# Patient Record
Sex: Female | Born: 2005 | Race: White | Hispanic: Yes | Marital: Single | Smoking: Never smoker
Health system: Southern US, Community
[De-identification: ages and names within clinical notes are randomized; demographics above are authoritative.]

## PROBLEM LIST (undated history)

## (undated) DIAGNOSIS — J45909 Unspecified asthma, uncomplicated: Secondary | ICD-10-CM

## (undated) HISTORY — DX: Unspecified asthma, uncomplicated: J45.909

---
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
## 2023-01-12 ENCOUNTER — Ambulatory Visit: Payer: Self-pay | Admitting: Pediatrics

## 2023-02-04 ENCOUNTER — Encounter: Payer: Self-pay | Admitting: Pediatrics

## 2023-02-04 ENCOUNTER — Ambulatory Visit: Payer: Medicaid Other | Admitting: Pediatrics

## 2023-02-04 VITALS — BP 116/70 | HR 80 | Temp 98.4°F | Ht 61.3 in | Wt 111.0 lb

## 2023-02-04 DIAGNOSIS — M41125 Adolescent idiopathic scoliosis, thoracolumbar region: Secondary | ICD-10-CM

## 2023-02-04 DIAGNOSIS — Z113 Encounter for screening for infections with a predominantly sexual mode of transmission: Secondary | ICD-10-CM | POA: Diagnosis not present

## 2023-02-04 DIAGNOSIS — Z0001 Encounter for general adult medical examination with abnormal findings: Secondary | ICD-10-CM | POA: Diagnosis not present

## 2023-02-04 DIAGNOSIS — Z0000 Encounter for general adult medical examination without abnormal findings: Secondary | ICD-10-CM

## 2023-02-04 DIAGNOSIS — Z00121 Encounter for routine child health examination with abnormal findings: Secondary | ICD-10-CM

## 2023-02-05 LAB — C. TRACHOMATIS/N. GONORRHOEAE RNA
C. trachomatis RNA, TMA: NOT DETECTED
N. gonorrhoeae RNA, TMA: NOT DETECTED

## 2023-02-15 NOTE — Progress Notes (Signed)
Adolescent Well Care Visit Nancy Price is a 17 y.o. female who is here for well care.    PCP:  Lucio Edward, MD   History was provided by the patient.  Confidentiality was discussed with the patient and, if applicable, with caregiver as well. Patient's personal or confidential phone number:     Current Issues: Current concerns include none.   Nutrition: Nutrition/Eating Behaviors: Varied diet Adequate calcium in diet?:  Yes Supplements/ Vitamins: No  Exercise/ Media: Play any Sports?/ Exercise: No Screen Time:  > 2 hours-counseling provided Media Rules or Monitoring?: no  Sleep:  Sleep: 7 to 8 hours  Social Screening: Lives with: Mother and siblings Parental relations:  good Activities, Work, and Regulatory affairs officer?:  Yes Concerns regarding behavior with peers?  no Stressors of note: no  Education: School Name: [employer], [employer], would like to either become a Naval architect School Grade: Senior School performance: doing well; no concerns School Behavior: doing well; no concerns  Menstruation:   No LMP recorded. Menstrual History: Regular  Confidential Social History: Tobacco?  no Secondhand smoke exposure?  no Drugs/ETOH?  no  Sexually Active?  yes   Pregnancy Prevention: Condoms, last sexual activity was 1 year ago  Safe at home, in school & in relationships?  Yes Safe to self?  Yes   Screenings: Patient has a dental home: yes   PHQ-9 completed and results indicated no concerns  Physical Exam:  Vitals:   02/04/23 1412  BP: 116/70  Pulse: 80  Temp: 98.4 F (36.9 C)  Weight: 111 lb (50.3 kg)  Height: 5' 1.3" (1.557 m)   BP 116/70   Pulse 80   Temp 98.4 F (36.9 C)   Ht 5' 1.3" (1.557 m)   Wt 111 lb (50.3 kg)   BMI 20.77 kg/m  Body mass index: body mass index is 20.77 kg/m. Blood pressure %iles are not available for patients who are 18 years or older.  Hearing Screening   500Hz  1000Hz  2000Hz  3000Hz  4000Hz    Right ear 20 20 20 20 20   Left ear 20 20 20 20 20    Vision Screening   Right eye Left eye Both eyes  Without correction 20/20 20/20 20/20   With correction       General Appearance:   alert, oriented, no acute distress and well nourished  HENT: Normocephalic, no obvious abnormality, conjunctiva clear  Mouth:   Normal appearing teeth, no obvious discoloration, dental caries, or dental caps  Neck:   Supple; thyroid: no enlargement, symmetric, no tenderness/mass/nodules  Chest Not examined  Lungs:   Clear to auscultation bilaterally, normal work of breathing  Heart:   Regular rate and rhythm, S1 and S2 normal, no murmurs;   Abdomen:   Soft, non-tender, no mass, or organomegaly  GU Not examined  Musculoskeletal:   Tone and strength strong and symmetrical, all extremities, mild cervical scoliosis noted               Lymphatic:   No cervical adenopathy  Skin/Hair/Nails:   Skin warm, dry and intact, no rashes, no bruises or petechiae  Neurologic:   Strength, gait, and coordination normal and age-appropriate     Assessment and Plan:   1.  Well-child check 2.  Scoliosis-mild  BMI is appropriate for age  Hearing screening result:normal Vision screening result: normal  Counseling provided for all of the vaccine components  Orders Placed This Encounter  Procedures   C. trachomatis/N. gonorrhoeae RNA  No follow-ups on file.Lucio Edward, MD

## 2023-10-11 ENCOUNTER — Ambulatory Visit
Admission: EM | Admit: 2023-10-11 | Discharge: 2023-10-11 | Disposition: A | Discharge status: Home / Self Care | Payer: Medicaid Other | Attending: Nurse Practitioner | Admitting: Nurse Practitioner

## 2023-10-11 DIAGNOSIS — U071 COVID-19: Secondary | ICD-10-CM

## 2023-10-11 LAB — POCT RAPID STREP A (OFFICE): Rapid Strep A Screen: NEGATIVE

## 2023-10-11 LAB — POC COVID19/FLU A&B COMBO
Covid Antigen, POC: POSITIVE — AB
Influenza A Antigen, POC: NEGATIVE
Influenza B Antigen, POC: NEGATIVE

## 2023-10-11 MED ORDER — PROMETHAZINE-DM 6.25-15 MG/5ML PO SYRP: 5.0000 mL | ORAL_SOLUTION | Freq: Four times a day (QID) | ORAL | 0 refills | Status: DC | PRN

## 2023-10-11 MED ORDER — FLUTICASONE PROPIONATE 50 MCG/ACT NA SUSP: 2.0000 | Freq: Every day | NASAL | 0 refills | Status: AC

## 2023-10-11 MED ORDER — PAXLOVID (300/100) 20 X 150 MG & 10 X 100MG PO TBPK: 3.0000 | ORAL_TABLET | Freq: Two times a day (BID) | ORAL | 0 refills | Status: AC

## 2023-10-11 MED ORDER — ALBUTEROL SULFATE HFA 108 (90 BASE) MCG/ACT IN AERS: 2.0000 | INHALATION_SPRAY | Freq: Four times a day (QID) | RESPIRATORY_TRACT | 0 refills | Status: AC | PRN

## 2023-10-11 NOTE — ED Provider Notes (Signed)
 [facility]    CSN: [id number] Arrival date & time: 10/11/23  1104      History   Chief Complaint Chief Complaint  Patient presents with   Sore Throat   Nasal Congestion    HPI Nancy Price is a 18 y.o. female.   The history is provided by the patient.   The patient presents with a 3-day history of sore throat, headache, nasal congestion, and cough.  Patient also suspects that she has had fevers.  States that she experienced episodes of feeling hot and then cold.  Patient denies ear pain, ear drainage, wheezing, difficulty breathing, chest pain, abdominal pain, nausea, vomiting, diarrhea, or rash.  Patient reports she has been taking Tylenol  and DayQuil for her symptoms.  Reports that several of her other family members have been sick with the same or similar symptoms.  Patient with underlying history of asthma.  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 No pertinent surgical history.  OB History   No obstetric history on file.      Home Medications    Prior to Admission medications   Medication Sig Start Date End Date Taking? Authorizing Provider  albuterol  (VENTOLIN  HFA) 108 (90 Base) MCG/ACT inhaler Inhale 2 puffs into the lungs every 6 (six) hours as needed. 10/11/23  Yes Leath-Warren, Etta PARAS, NP  fluticasone  (FLONASE ) 50 MCG/ACT nasal spray Place 2 sprays into both nostrils daily. 10/11/23  Yes Leath-Warren, Etta PARAS, NP  nirmatrelvir/ritonavir (PAXLOVID , 300/100,) 20 x 150 MG & 10 x 100MG  TBPK Take 3 tablets by mouth 2 (two) times daily for 5 days. Take nirmatrelvir (150 mg) two tablets twice daily for 5 days and ritonavir (100 mg) one tablet twice daily for 5 days. 10/11/23 10/16/23 Yes  Leath-Warren, Etta PARAS, NP  promethazine -dextromethorphan  (PROMETHAZINE -DM) 6.25-15 MG/5ML syrup Take 5 mLs by mouth 4 (four) times daily as needed. 10/11/23  Yes Leath-Warren, Etta PARAS, NP  aluminum chloride (DRYSOL) 20 % external solution Dispense brand or generic for insurance. Apply to skin at bedtime and rinse off in the mornings Patient not taking: Reported on 02/04/2023 07/30/20   Theotis Allena HERO, MD  cetirizine  (ZYRTEC ) 10 MG tablet Take 1 tablet (10 mg total) by mouth daily. Patient not taking: Reported on 02/04/2023 06/10/18   Theotis Allena HERO, MD  Glycopyrronium Tosylate  (QBREXZA ) 2.4 % PADS Wipe each underarm and hands with one cloth in evening. Let hands dry and then wash hands twice with soap and water after 15 minutes Patient not taking: Reported on 02/04/2023 01/09/21   Hester Alm BROCKS, MD  triamcinolone  (ORALONE ) 0.1 % paste Use as directed 1 application in the mouth or throat 2 (two) times daily. Patient not taking: Reported on 02/04/2023 08/27/20   Avegno, Komlanvi S, FNP    Family History Family History  Problem Relation Age of Onset   Malignant hyperthermia Mother    Diabetes Father    Asthma Sister    Cancer Paternal Grandmother    Liver disease Paternal Grandmother    Diabetes Maternal Uncle    Diabetes Maternal Grandfather    Asthma Brother     Social History Social History   Tobacco Use   Smoking status: Never   Smokeless tobacco:  Never  Substance Use Topics   Alcohol use: No   Drug use: No     Allergies   Patient has no known allergies.   Review of Systems Review of Systems Per HPI  Physical Exam Triage Vital Signs ED Triage Vitals [10/11/23 1136]  Encounter Vitals Group     BP 128/82     Systolic BP Percentile      Diastolic BP Percentile      Pulse Rate 99     Resp 14     Temp 98 F (36.7 C)     Temp Source Oral     SpO2 100 %     Weight      Height      Head Circumference      Peak Flow      Pain Score 4     Pain Loc       Pain Education      Exclude from Growth Chart    No data found.  Updated Vital Signs BP 128/82 (BP Location: Right Arm)   Pulse 99   Temp 98 F (36.7 C) (Oral)   Resp 14   LMP 09/12/2023 (Approximate)   SpO2 100%   Visual Acuity Right Eye Distance:   Left Eye Distance:   Bilateral Distance:    Right Eye Near:   Left Eye Near:    Bilateral Near:     Physical Exam Vitals and nursing note reviewed.  Constitutional:      General: She is not in acute distress.    Appearance: She is well-developed.  HENT:     Head: Normocephalic.     Right Ear: Tympanic membrane, ear canal and external ear normal.     Left Ear: Tympanic membrane, ear canal and external ear normal.     Nose: Congestion present.     Right Turbinates: Enlarged and swollen.     Left Turbinates: Enlarged and swollen.     Right Sinus: No maxillary sinus tenderness or frontal sinus tenderness.     Left Sinus: No maxillary sinus tenderness or frontal sinus tenderness.     Mouth/Throat:     Lips: Pink.     Mouth: Mucous membranes are moist.     Pharynx: Uvula midline. Pharyngeal swelling, posterior oropharyngeal erythema and postnasal drip present. No oropharyngeal exudate or uvula swelling.     Tonsils: No tonsillar exudate. 1+ on the right. 1+ on the left.     Comments: Cobblestoning present to posterior oropharynx  Eyes:     Conjunctiva/sclera: Conjunctivae normal.     Pupils: Pupils are equal, round, and reactive to light.  Cardiovascular:     Rate and Rhythm: Normal rate and regular rhythm.     Pulses: Normal pulses.     Heart sounds: Normal heart sounds.  Abdominal:     General: Bowel sounds are normal.     Palpations: Abdomen is soft.     Tenderness: There is no abdominal tenderness.  Musculoskeletal:     Cervical back: Normal range of motion.  Lymphadenopathy:     Cervical: No cervical adenopathy.  Skin:    General: Skin is warm and dry.  Neurological:     General: No focal deficit present.      Mental Status: She is alert and oriented to person, place, and time.  Psychiatric:        Mood and Affect: Mood normal.        Behavior: Behavior normal.      UC  Treatments / Results  Labs (all labs ordered are listed, but only abnormal results are displayed) Labs Reviewed  POC COVID19/FLU A&B COMBO - Abnormal; Notable for the following components:      Result Value   Covid Antigen, POC Positive (*)    All other components within normal limits  POCT RAPID STREP A (OFFICE) - Normal    EKG   Radiology No results found.  Procedures Procedures (including critical care time)  Medications Ordered in UC Medications - No data to display  Initial Impression / Assessment and Plan / UC Course  I have reviewed the triage vital signs and the nursing notes.  Pertinent labs & imaging results that were available during my care of the patient were reviewed by me and considered in my medical decision making (see chart for details).  The rapid strep test was negative, COVID/flu test was positive for COVID.  Will start patient on Paxlovid .  Additional symptomatic treatment provided with Promethazine  DM for the cough, and fluticasone  50 mcg nasal spray for nasal congestion.  Patient was provided albuterol  inhaler for cough, wheezing, or shortness of breath.  Supportive care recommendations were provided and discussed with the patient to include fluids, rest, over-the-counter analgesics, use of a humidifier at nighttime during sleep.  Discussed indications with patient regarding follow-up.  Patient was also advised regarding COVID isolation precautions at this time.  Patient was in agreement with this plan of care and verbalizes understanding.  All questions were answered.  Patient stable for discharge.  Work note was provided.  Final Clinical Impressions(s) / UC Diagnoses   Final diagnoses:  COVID     Discharge Instructions      Take medication as prescribed. Increase fluids and allow for  plenty of rest. You may take over-the-counter Tylenol  as needed for pain, fever, general discomfort. Warm salt water gargles throughout the day to help with sore throat. Normal saline nasal spray for nasal congestion and runny nose.  You may use the nasal spray you have at home to help with the nasal congestion and runny nose. For the cough, recommend using a humidifier in the bedroom at nighttime during sleep and sleeping elevated on pillows while cough symptoms persist. As discussed, you will need to remain home if you develop a fever.  You can return to your normal activities when she has been fever free for 24 hours with no medication.  If you have symptoms, you can return to your normal activities as long as you are wearing a mask.  If you continue to experience symptoms after completing the medication, continue to wear your mask for an additional 5 days. Follow-up in the emergency department immediately if you experience fevers, shortness of breath, difficulty breathing, or other concerns. Please notify your primary care physician of your recent positive COVID test. Follow-up as needed.     ED Prescriptions     Medication Sig Dispense Auth. Provider   nirmatrelvir/ritonavir (PAXLOVID , 300/100,) 20 x 150 MG & 10 x 100MG  TBPK Take 3 tablets by mouth 2 (two) times daily for 5 days. Take nirmatrelvir (150 mg) two tablets twice daily for 5 days and ritonavir (100 mg) one tablet twice daily for 5 days. 30 tablet Leath-Warren, Etta PARAS, NP   promethazine -dextromethorphan  (PROMETHAZINE -DM) 6.25-15 MG/5ML syrup Take 5 mLs by mouth 4 (four) times daily as needed. 118 mL Leath-Warren, Etta PARAS, NP   fluticasone  (FLONASE ) 50 MCG/ACT nasal spray Place 2 sprays into both nostrils daily. 16 g Leath-Warren, Etta PARAS, NP  albuterol  (VENTOLIN  HFA) 108 (90 Base) MCG/ACT inhaler Inhale 2 puffs into the lungs every 6 (six) hours as needed. 8 g Leath-Warren, Etta PARAS, NP      PDMP not reviewed this  encounter.   Gilmer Etta PARAS, NP 10/11/23 1319

## 2023-10-11 NOTE — Discharge Instructions (Signed)
 Take medication as prescribed. Increase fluids and allow for plenty of rest. You may take over-the-counter Tylenol  as needed for pain, fever, general discomfort. Warm salt water gargles throughout the day to help with sore throat. Normal saline nasal spray for nasal congestion and runny nose.  You may use the nasal spray you have at home to help with the nasal congestion and runny nose. For the cough, recommend using a humidifier in the bedroom at nighttime during sleep and sleeping elevated on pillows while cough symptoms persist. As discussed, you will need to remain home if you develop a fever.  You can return to your normal activities when she has been fever free for 24 hours with no medication.  If you have symptoms, you can return to your normal activities as long as you are wearing a mask.  If you continue to experience symptoms after completing the medication, continue to wear your mask for an additional 5 days. Follow-up in the emergency department immediately if you experience fevers, shortness of breath, difficulty breathing, or other concerns. Please notify your primary care physician of your recent positive COVID test. Follow-up as needed.

## 2023-10-11 NOTE — ED Triage Notes (Signed)
 Sore throat, congestion, cough, headache, possible fever pt states she felt warm but did not check the temperature x 3 days. Taking tylenol and dyquil.

## 2024-04-24 ENCOUNTER — Ambulatory Visit
Admission: EM | Admit: 2024-04-24 | Discharge: 2024-04-24 | Disposition: A | Discharge status: Home / Self Care | Attending: Nurse Practitioner | Admitting: Nurse Practitioner

## 2024-04-24 DIAGNOSIS — R399 Unspecified symptoms and signs involving the genitourinary system: Secondary | ICD-10-CM | POA: Diagnosis not present

## 2024-04-24 DIAGNOSIS — Z113 Encounter for screening for infections with a predominantly sexual mode of transmission: Secondary | ICD-10-CM | POA: Insufficient documentation

## 2024-04-24 LAB — POCT URINALYSIS DIP (MANUAL ENTRY)
Glucose, UA: 100 mg/dL — AB
Nitrite, UA: POSITIVE — AB
Protein Ur, POC: >=300 mg/dL — AB
Spec Grav, UA: 1.015 (ref 1.010–1.025)
Urobilinogen, UA: 4 U/dL — AB
pH, UA: 5.5 (ref 5.0–8.0)

## 2024-04-24 LAB — POCT URINE PREGNANCY: Preg Test, Ur: NEGATIVE

## 2024-04-24 MED ORDER — NITROFURANTOIN MONOHYD MACRO 100 MG PO CAPS: 100.0000 mg | ORAL_CAPSULE | Freq: Two times a day (BID) | ORAL | 0 refills | Status: DC

## 2024-04-24 NOTE — ED Provider Notes (Signed)
 [facility]    CSN: [id number] Arrival date & time: 04/24/24  1748      History   Chief Complaint Chief Complaint  Patient presents with   Urinary Frequency    HPI Nancy Price is a 18 y.o. female.   The history is provided by the patient.   Patient presents with a 3-day history of pain with urination, urinary frequency, lower abdominal pain, and malodorous urine.  Patient is also requesting testing for STIs.  She denies fever, chills, chest pain, abdominal pain, hematuria, decreased urine stream, flank pain, low back pain, vaginal discharge, vaginal odor, or vaginal itching.  She reports 1 female partner in the past 90 days.  She denies prior history of urinary tract infection or STI/STD.  States she has been taking Azo for her symptoms.  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 No pertinent surgical history.  OB History   No obstetric history on file.      Home Medications    Prior to Admission medications   Medication Sig Start Date End Date Taking? Authorizing Provider  nitrofurantoin , macrocrystal-monohydrate, (MACROBID ) 100 MG capsule Take 1 capsule (100 mg total) by mouth 2 (two) times daily. 04/24/24  Yes Leath-Warren, Etta PARAS, NP  albuterol  (VENTOLIN  HFA) 108 (90 Base) MCG/ACT inhaler Inhale 2 puffs into the lungs every 6 (six) hours as needed. 10/11/23   Leath-Warren, Etta PARAS, NP  aluminum chloride (DRYSOL) 20 % external solution Dispense brand or generic for insurance. Apply to skin at bedtime and rinse off in the mornings Patient not taking: Reported on 02/04/2023 07/30/20   Theotis Allena HERO, MD  cetirizine  (ZYRTEC ) 10 MG tablet Take 1 tablet (10 mg total) by mouth daily. Patient not  taking: Reported on 02/04/2023 06/10/18   Theotis Allena HERO, MD  fluticasone  (FLONASE ) 50 MCG/ACT nasal spray Place 2 sprays into both nostrils daily. 10/11/23   Leath-Warren, Etta PARAS, NP  Glycopyrronium Tosylate  (QBREXZA ) 2.4 % PADS Wipe each underarm and hands with one cloth in evening. Let hands dry and then wash hands twice with soap and water after 15 minutes Patient not taking: Reported on 02/04/2023 01/09/21   Hester Alm BROCKS, MD  promethazine -dextromethorphan  (PROMETHAZINE -DM) 6.25-15 MG/5ML syrup Take 5 mLs by mouth 4 (four) times daily as needed. 10/11/23   Leath-Warren, Etta PARAS, NP  triamcinolone  (ORALONE ) 0.1 % paste Use as directed 1 application in the mouth or throat 2 (two) times daily. Patient not taking: Reported on 02/04/2023 08/27/20   Avegno, Komlanvi S, FNP    Family History Family History  Problem Relation Age of Onset   Malignant hyperthermia Mother    Diabetes Father    Asthma Sister    Cancer Paternal Grandmother    Liver disease Paternal Grandmother    Diabetes Maternal Uncle    Diabetes Maternal Grandfather    Asthma Brother     Social History Social History   Tobacco Use   Smoking status: Never   Smokeless tobacco: Never  Substance Use Topics   Alcohol use: No   Drug use: No     Allergies   Patient has no known allergies.   Review of Systems Review of Systems Per HPI  Physical Exam Triage Vital  Signs ED Triage Vitals  Encounter Vitals Group     BP 04/24/24 1852 109/74     Girls Systolic BP Percentile --      Girls Diastolic BP Percentile --      Boys Systolic BP Percentile --      Boys Diastolic BP Percentile --      Pulse Rate 04/24/24 1852 89     Resp 04/24/24 1852 16     Temp 04/24/24 1852 98.7 F (37.1 C)     Temp Source 04/24/24 1852 Oral     SpO2 04/24/24 1852 98 %     Weight --      Height --      Head Circumference --      Peak Flow --      Pain Score 04/24/24 1853 4     Pain Loc --      Pain Education --      Exclude  from Growth Chart --    No data found.  Updated Vital Signs BP 109/74 (BP Location: Right Arm)   Pulse 89   Temp 98.7 F (37.1 C) (Oral)   Resp 16   LMP 03/31/2024 (Approximate)   SpO2 98%   Visual Acuity Right Eye Distance:   Left Eye Distance:   Bilateral Distance:    Right Eye Near:   Left Eye Near:    Bilateral Near:     Physical Exam Vitals and nursing note reviewed.  Constitutional:      General: She is not in acute distress.    Appearance: Normal appearance.  HENT:     Head: Normocephalic.  Eyes:     Extraocular Movements: Extraocular movements intact.     Pupils: Pupils are equal, round, and reactive to light.  Cardiovascular:     Rate and Rhythm: Normal rate and regular rhythm.     Pulses: Normal pulses.     Heart sounds: Normal heart sounds.  Pulmonary:     Effort: Pulmonary effort is normal.     Breath sounds: Normal breath sounds.  Abdominal:     General: Bowel sounds are normal.     Palpations: Abdomen is soft.     Tenderness: There is abdominal tenderness. There is no right CVA tenderness or left CVA tenderness.  Genitourinary:    Comments: GU exam deferred, self swab performed  Musculoskeletal:     Cervical back: Normal range of motion.  Skin:    General: Skin is warm and dry.  Neurological:     General: No focal deficit present.     Mental Status: She is alert and oriented to person, place, and time.  Psychiatric:        Mood and Affect: Mood normal.        Behavior: Behavior normal.      UC Treatments / Results  Labs (all labs ordered are listed, but only abnormal results are displayed) Labs Reviewed  POCT URINALYSIS DIP (MANUAL ENTRY) - Abnormal; Notable for the following components:      Result Value   Color, UA orange (*)    Glucose, UA =100 (*)    Bilirubin, UA small (*)    Ketones, POC UA trace (5) (*)    Blood, UA moderate (*)    Protein Ur, POC >=300 (*)    Urobilinogen, UA 4.0 (*)    Nitrite, UA Positive (*)     Leukocytes, UA Large (3+) (*)    All other components within normal limits  POCT URINE  PREGNANCY - Normal  URINE CULTURE  CERVICOVAGINAL ANCILLARY ONLY    EKG   Radiology No results found.  Procedures Procedures (including critical care time)  Medications Ordered in UC Medications - No data to display  Initial Impression / Assessment and Plan / UC Course  I have reviewed the triage vital signs and the nursing notes.  Pertinent labs & imaging results that were available during my care of the patient were reviewed by me and considered in my medical decision making (see chart for details).  Patient's symptoms consistent with UTI, urinalysis results are skewed due to Azo.  Urine culture has been ordered.  In the interim, we will treat for possible UTI with Macrobid  100 mg.  Cytology swab is pending.  Supportive care recommendations were provided discussed with the patient to include fluids, rest, developing a toileting schedule, avoiding caffeine, and increasing condom use with each sexual encounter.  Discussed with patient if the urine culture result is negative and she is continuing to experience symptoms, recommend follow-up with her PCP for further evaluation.  Patient was in agreement with this plan of care and verbalizes understanding.  All questions were answered.  Patient stable for discharge.  Final Clinical Impressions(s) / UC Diagnoses   Final diagnoses:  UTI symptoms  Screening for STD (sexually transmitted disease)     Discharge Instructions      Urine culture and cytology swab are pending.  You will be contacted when the results of the pending test are received.  You may be advised to stop the antibiotic prescribed today or be notified that the medication needs to be changed.  You will also have access to your results via MyChart. Take medication as prescribed. Increase fluids and allow for plenty of rest.  Try to drink at least 8-10 8 ounce glasses of water daily  while symptoms persist. You may take over-the-counter Tylenol  or ibuprofen  as needed for pain, fever, or general discomfort. Develop a toileting schedule that will allow you to urinate at least every 2 hours. Avoid caffeine such as tea, soda, or coffee while symptoms persist. Make sure you are voiding at least 15 to 20 minutes after each sexual encounter. Refrain from sexual intercourse until your cytology swab results have been received.  If your results are positive, you will need to notify all partners.  If treatment is required, you will need to refrain from sexual intercourse for an additional 7 days after completing treatment. Follow-up as needed.     ED Prescriptions     Medication Sig Dispense Auth. Provider   nitrofurantoin , macrocrystal-monohydrate, (MACROBID ) 100 MG capsule Take 1 capsule (100 mg total) by mouth 2 (two) times daily. 10 capsule Leath-Warren, Etta PARAS, NP      PDMP not reviewed this encounter.   Gilmer Etta PARAS, NP 04/24/24 1934

## 2024-04-24 NOTE — ED Triage Notes (Addendum)
 Burning with urination, urinary frequency, lower abdominal pain/pressure, odor to urine x 3 days. Taking AZO.  Pt would also like to be tested for STD`s while here.

## 2024-04-24 NOTE — Discharge Instructions (Addendum)
 Urine culture and cytology swab are pending.  You will be contacted when the results of the pending test are received.  You may be advised to stop the antibiotic prescribed today or be notified that the medication needs to be changed.  You will also have access to your results via MyChart. Take medication as prescribed. Increase fluids and allow for plenty of rest.  Try to drink at least 8-10 8 ounce glasses of water daily while symptoms persist. You may take over-the-counter Tylenol  or ibuprofen  as needed for pain, fever, or general discomfort. Develop a toileting schedule that will allow you to urinate at least every 2 hours. Avoid caffeine such as tea, soda, or coffee while symptoms persist. Make sure you are voiding at least 15 to 20 minutes after each sexual encounter. Refrain from sexual intercourse until your cytology swab results have been received.  If your results are positive, you will need to notify all partners.  If treatment is required, you will need to refrain from sexual intercourse for an additional 7 days after completing treatment. Follow-up as needed.

## 2024-04-25 LAB — CERVICOVAGINAL ANCILLARY ONLY
Bacterial Vaginitis (gardnerella): NEGATIVE
Candida Glabrata: NEGATIVE
Candida Vaginitis: NEGATIVE
Chlamydia: NEGATIVE
Comment: NEGATIVE
Comment: NEGATIVE
Comment: NEGATIVE
Comment: NEGATIVE
Comment: NEGATIVE
Comment: NORMAL
Neisseria Gonorrhea: NEGATIVE
Trichomonas: NEGATIVE

## 2024-04-26 ENCOUNTER — Ambulatory Visit (HOSPITAL_COMMUNITY): Payer: Self-pay

## 2024-04-26 LAB — URINE CULTURE: Culture: >=100000 — AB

## 2024-05-04 DIAGNOSIS — Z7689 Persons encountering health services in other specified circumstances: Secondary | ICD-10-CM | POA: Diagnosis not present

## 2024-05-04 DIAGNOSIS — Z23 Encounter for immunization: Secondary | ICD-10-CM | POA: Diagnosis not present

## 2024-06-29 ENCOUNTER — Ambulatory Visit
Admission: EM | Admit: 2024-06-29 | Discharge: 2024-06-29 | Disposition: A | Discharge status: Home / Self Care | Attending: Nurse Practitioner | Admitting: Nurse Practitioner

## 2024-06-29 DIAGNOSIS — N3001 Acute cystitis with hematuria: Secondary | ICD-10-CM

## 2024-06-29 LAB — POCT URINE DIPSTICK
Bilirubin, UA: NEGATIVE
Glucose, UA: NEGATIVE mg/dL
Ketones, POC UA: NEGATIVE mg/dL
Nitrite, UA: NEGATIVE
POC PROTEIN,UA: NEGATIVE
Spec Grav, UA: 1.01 (ref 1.010–1.025)
Urobilinogen, UA: 0.2 U/dL
pH, UA: 5.5 (ref 5.0–8.0)

## 2024-06-29 MED ORDER — CEPHALEXIN 500 MG PO CAPS: 500.0000 mg | ORAL_CAPSULE | Freq: Two times a day (BID) | ORAL | 0 refills | Status: AC

## 2024-06-29 NOTE — ED Provider Notes (Signed)
 [facility]    CSN: [id number] Arrival date & time: 06/29/24  1216      History   Chief Complaint No chief complaint on file.   HPI Nancy Price is a 18 y.o. female.   Patient presents today with 5 day history of burning with urination, increased urinary frequency and urgency, and change in urine color.  She is also having vaginal discharge that she describes as yellow and occasionally smells foul which started around the same time.  She is sexually active but is not concerned for UTI today.  She denies abdominal or pelvic pain, nausea/vomiting, fever, groin swelling, and rashes/sore/lesions on her genitalia.  No known exposures to STI.  Has taken ibuprofen  for the symptoms with minimal temporary improvement.  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 No pertinent surgical history.  OB History   No obstetric history on file.      Home Medications    Prior to Admission medications   Medication Sig Start Date End Date Taking? Authorizing Provider  cephALEXin (KEFLEX) 500 MG capsule Take 1 capsule (500 mg total) by mouth 2 (two) times daily for 5 days. 06/29/24 07/04/24 Yes Chandra Harlene LABOR, NP  albuterol  (VENTOLIN  HFA) 108 (90 Base) MCG/ACT inhaler Inhale 2 puffs into the lungs every 6 (six) hours as needed. 10/11/23   Leath-Warren, Etta PARAS, NP  aluminum chloride (DRYSOL) 20 % external solution Dispense brand or generic for insurance. Apply to skin at bedtime and rinse off in the mornings Patient not taking: Reported on  02/04/2023 07/30/20   Theotis Allena HERO, MD  cetirizine  (ZYRTEC ) 10 MG tablet Take 1 tablet (10 mg total) by mouth daily. Patient not taking: Reported on 02/04/2023 06/10/18   Theotis Allena HERO, MD  fluticasone  (FLONASE ) 50 MCG/ACT nasal spray Place 2 sprays into both nostrils daily. 10/11/23   Leath-Warren, Etta PARAS, NP  Glycopyrronium Tosylate  (QBREXZA ) 2.4 % PADS Wipe each underarm and hands with one cloth in evening. Let hands dry and then wash hands twice with soap and water after 15 minutes Patient not taking: Reported on 02/04/2023 01/09/21   Hester Alm BROCKS, MD  triamcinolone  (ORALONE ) 0.1 % paste Use as directed 1 application in the mouth or throat 2 (two) times daily. Patient not taking: Reported on 02/04/2023 08/27/20   Avegno, Komlanvi S, FNP    Family History Family History  Problem Relation Age of Onset   Malignant hyperthermia Mother    Diabetes Father    Asthma Sister    Cancer Paternal Grandmother    Liver disease Paternal Grandmother    Diabetes Maternal Uncle    Diabetes Maternal Grandfather    Asthma Brother     Social History Social History   Tobacco Use   Smoking status: Never   Smokeless tobacco: Never  Substance Use Topics   Alcohol use: No   Drug use: No     Allergies   Patient has no known allergies.   Review of Systems Review of Systems Per HPI  Physical Exam Triage Vital  Signs ED Triage Vitals  Encounter Vitals Group     BP 06/29/24 1224 106/71     Girls Systolic BP Percentile --      Girls Diastolic BP Percentile --      Boys Systolic BP Percentile --      Boys Diastolic BP Percentile --      Pulse Rate 06/29/24 1224 73     Resp 06/29/24 1224 20     Temp 06/29/24 1224 98.4 F (36.9 C)     Temp Source 06/29/24 1224 Oral     SpO2 06/29/24 1224 98 %     Weight --      Height --      Head Circumference --      Peak Flow --      Pain Score 06/29/24 1227 5     Pain Loc --      Pain Education --      Exclude from Growth Chart --     No data found.  Updated Vital Signs BP 106/71 (BP Location: Right Arm)   Pulse 73   Temp 98.4 F (36.9 C) (Oral)   Resp 20   LMP 06/21/2024 (Exact Date)   SpO2 98%   Visual Acuity Right Eye Distance:   Left Eye Distance:   Bilateral Distance:    Right Eye Near:   Left Eye Near:    Bilateral Near:     Physical Exam Vitals and nursing note reviewed.  Constitutional:      General: She is not in acute distress.    Appearance: She is not toxic-appearing.  HENT:     Mouth/Throat:     Mouth: Mucous membranes are moist.     Pharynx: Oropharynx is clear.  Pulmonary:     Effort: Pulmonary effort is normal. No respiratory distress.  Abdominal:     General: Abdomen is flat. Bowel sounds are normal. There is no distension.     Palpations: Abdomen is soft. There is no mass.     Tenderness: There is no abdominal tenderness. There is no right CVA tenderness, left CVA tenderness or guarding.  Genitourinary:    Comments: Deferred - patient declines today Skin:    General: Skin is warm and dry.     Coloration: Skin is not jaundiced or pale.     Findings: No erythema.  Neurological:     Mental Status: She is alert and oriented to person, place, and time.     Motor: No weakness.     Gait: Gait normal.  Psychiatric:        Behavior: Behavior is cooperative.      UC Treatments / Results  Labs (all labs ordered are listed, but only abnormal results are displayed) Labs Reviewed  POCT URINE DIPSTICK - Abnormal; Notable for the following components:      Result Value   Clarity, UA cloudy (*)    Blood, UA trace-intact (*)    Leukocytes, UA Large (3+) (*)    All other components within normal limits  URINE CULTURE    EKG   Radiology No results found.  Procedures Procedures (including critical care time)  Medications Ordered in UC Medications - No data to display  Initial Impression / Assessment and Plan / UC Course  I have reviewed the triage vital signs and the  nursing notes.  Pertinent labs & imaging results that were available during my care of the patient were reviewed by me and considered in my medical decision making (see  chart for details).   Patient is well-appearing, normotensive, afebrile, not tachycardic, not tachypneic, oxygenating well on room air.   1. Acute cystitis with hematuria Ua today is positive for blood and leukocytes Will treat for UTI with Keflex Urine culture is pending for today I recommended follow up if vaginal discharge persists after treated for UTI Strict ER precautions discussed   The patient was given the opportunity to ask questions.  All questions answered to their satisfaction.  The patient is in agreement to this plan.   Final Clinical Impressions(s) / UC Diagnoses   Final diagnoses:  Acute cystitis with hematuria     Discharge Instructions      The urine sample today shows a UTI.  Take the Keflex twice daily for 5 days to treat it.  Continue hydration with plenty of fluids.  We will contact you in a few days if the urine culture shows we need to change antibiotic.     ED Prescriptions     Medication Sig Dispense Auth. Provider   cephALEXin (KEFLEX) 500 MG capsule Take 1 capsule (500 mg total) by mouth 2 (two) times daily for 5 days. 10 capsule Chandra Harlene LABOR, NP      PDMP not reviewed this encounter.   Chandra Harlene LABOR, NP 06/29/24 [phone]

## 2024-06-29 NOTE — Discharge Instructions (Signed)
 The urine sample today shows a UTI.  Take the Keflex twice daily for 5 days to treat it.  Continue hydration with plenty of fluids.  We will contact you in a few days if the urine culture shows we need to change antibiotic.

## 2024-06-29 NOTE — ED Triage Notes (Signed)
 Pt reports burning with urination, discolored urine. Started Saturday. Pain has increased throughout the week. Pt has used Ibuprofen  to help with urinary discomfort.

## 2024-07-01 LAB — URINE CULTURE: Culture: >=100000 — AB

## 2024-07-03 ENCOUNTER — Ambulatory Visit (HOSPITAL_COMMUNITY): Payer: Self-pay
